# Patient Record
Sex: Male | Born: 1975 | Race: White | Hispanic: No | Marital: Married | Smoking: Never smoker
Health system: Southern US, Community
[De-identification: ages and names within clinical notes are randomized; demographics above are authoritative.]

## PROBLEM LIST (undated history)

## (undated) HISTORY — PX: EYE SURGERY: SHX253

## (undated) HISTORY — PX: KNEE ARTHROSCOPY W/ MENISCAL REPAIR: SHX1877

---

## 2006-09-08 ENCOUNTER — Emergency Department (HOSPITAL_COMMUNITY): Admission: EM | Admit: 2006-09-08 | Discharge: 2006-09-08 | Payer: Self-pay | Admitting: Emergency Medicine

## 2006-09-09 ENCOUNTER — Ambulatory Visit (HOSPITAL_COMMUNITY): Admission: RE | Admit: 2006-09-09 | Discharge: 2006-09-09 | Payer: Self-pay | Admitting: Orthopedic Surgery

## 2006-12-26 ENCOUNTER — Emergency Department (HOSPITAL_COMMUNITY): Admission: EM | Admit: 2006-12-26 | Discharge: 2006-12-26 | Payer: Self-pay | Admitting: Emergency Medicine

## 2007-03-19 ENCOUNTER — Encounter (INDEPENDENT_AMBULATORY_CARE_PROVIDER_SITE_OTHER): Payer: Self-pay | Admitting: Orthopedic Surgery

## 2007-03-19 ENCOUNTER — Ambulatory Visit (HOSPITAL_COMMUNITY): Admission: RE | Admit: 2007-03-19 | Discharge: 2007-03-19 | Payer: Self-pay | Admitting: Orthopedic Surgery

## 2007-03-19 ENCOUNTER — Ambulatory Visit: Payer: Self-pay | Admitting: Vascular Surgery

## 2008-03-30 ENCOUNTER — Encounter: Admission: RE | Admit: 2008-03-30 | Discharge: 2008-03-30 | Payer: Self-pay | Admitting: Internal Medicine

## 2009-09-06 ENCOUNTER — Emergency Department (HOSPITAL_BASED_OUTPATIENT_CLINIC_OR_DEPARTMENT_OTHER): Admission: EM | Admit: 2009-09-06 | Discharge: 2009-09-06 | Payer: Self-pay | Admitting: Emergency Medicine

## 2009-09-07 ENCOUNTER — Emergency Department (HOSPITAL_BASED_OUTPATIENT_CLINIC_OR_DEPARTMENT_OTHER): Admission: EM | Admit: 2009-09-07 | Discharge: 2009-09-07 | Payer: Self-pay | Admitting: Emergency Medicine

## 2010-09-25 LAB — URINALYSIS, ROUTINE W REFLEX MICROSCOPIC
Ketones, ur: NEGATIVE mg/dL
Nitrite: NEGATIVE
Protein, ur: NEGATIVE mg/dL

## 2010-12-19 ENCOUNTER — Emergency Department (HOSPITAL_BASED_OUTPATIENT_CLINIC_OR_DEPARTMENT_OTHER)
Admission: EM | Admit: 2010-12-19 | Discharge: 2010-12-19 | Disposition: A | Discharge status: Home / Self Care | Payer: Self-pay | Attending: Emergency Medicine | Admitting: Emergency Medicine

## 2010-12-19 DIAGNOSIS — K029 Dental caries, unspecified: Secondary | ICD-10-CM | POA: Insufficient documentation

## 2010-12-19 DIAGNOSIS — F17200 Nicotine dependence, unspecified, uncomplicated: Secondary | ICD-10-CM | POA: Insufficient documentation

## 2011-03-28 ENCOUNTER — Encounter: Payer: Self-pay | Admitting: *Deleted

## 2011-03-28 ENCOUNTER — Emergency Department (HOSPITAL_BASED_OUTPATIENT_CLINIC_OR_DEPARTMENT_OTHER)
Admission: EM | Admit: 2011-03-28 | Discharge: 2011-03-29 | Disposition: A | Discharge status: Home / Self Care | Payer: Self-pay | Attending: Emergency Medicine | Admitting: Emergency Medicine

## 2011-03-28 DIAGNOSIS — M5430 Sciatica, unspecified side: Secondary | ICD-10-CM | POA: Insufficient documentation

## 2011-03-28 DIAGNOSIS — M549 Dorsalgia, unspecified: Secondary | ICD-10-CM | POA: Insufficient documentation

## 2011-03-28 NOTE — ED Notes (Signed)
Walking and tripped this afternoon, twisted his back and pain has become increasingly worse

## 2011-03-29 MED ORDER — HYDROCODONE-ACETAMINOPHEN 5-325 MG PO TABS
ORAL_TABLET | ORAL | Status: AC
  Administered 2011-03-29: 2
  Filled 2011-03-29: qty 2

## 2011-03-29 MED ORDER — HYDROCODONE-ACETAMINOPHEN 10-325 MG PO TABS: 1.0000 | ORAL_TABLET | Freq: Four times a day (QID) | ORAL | Status: AC | PRN

## 2011-03-29 MED ORDER — NAPROXEN SODIUM 220 MG PO TABS: 440.0000 mg | ORAL_TABLET | Freq: Two times a day (BID) | ORAL | Status: AC

## 2011-03-29 NOTE — ED Notes (Signed)
Care plan and use of Meds reviewed follow up with Dr Jamey Ripa this week.

## 2011-03-29 NOTE — ED Notes (Signed)
Family at bedside.Care plan and safe use of meds reviewed

## 2011-03-29 NOTE — ED Provider Notes (Signed)
History     CSN: [id number] Arrival date & time: 03/28/2011 11:21 PM  Chief Complaint  Patient presents with  . Back Pain    (Consider location/radiation/quality/duration/timing/severity/associated sxs/prior treatment) HPI This is a 35 year old white male who was walking to the mailbox this afternoon and twisted wrong. He states it was sudden pain in the right lower back. This subsequently worsened and radiates down the back of his right leg. It is severe at its worst. It is worse with movement and improved when bearing weight on his left leg. There is also some improvement with certain positions. There is no associated numbness or weakness. There is no saddle anesthesia or bowel or bladder changes. He has had back pain in the past but never this severe.  History reviewed. No pertinent past medical history.  History reviewed. No pertinent past surgical history.  No family history on file.  History  Substance Use Topics  . Smoking status: Never Smoker   . Smokeless tobacco: Not on file  . Alcohol Use: No      Review of Systems  All other systems reviewed and are negative.    Allergies  Review of patient's allergies indicates no known allergies.  Home Medications   Current Outpatient Rx  Name Route Sig Dispense Refill  . IBUPROFEN 200 MG PO TABS Oral Take 400 mg by mouth every 6 (six) hours as needed. For pain       BP 140/87  Pulse 85  Temp(Src) 98 F (36.7 C) (Oral)  Resp 18  SpO2 100%  Physical Exam General: Well-developed, well-nourished male in no acute distress; appearance consistent with age of record HENT: normocephalic, atraumatic Eyes: pupils equal round and reactive to light; extraocular muscles intact Neck: supple Heart: regular rate and rhythm Lungs: clear to auscultation bilaterally Abdomen: soft; nontender; nondistended Extremities: No deformity; decreased range of motion range of motion right hip due to pain; pulses normal Back: Right lower  paralumbar tenderness Neurologic: Awake, alert and oriented;motor function intact in all extremities and symmetric;sensation grossly intact; no facial droop Skin: Warm and dry   ED Course  Procedures (including critical care time)  Labs Reviewed - No data to display No results found.   No diagnosis found.    MDM          Hanley Seamen, MD 03/29/11 [phone]

## 2011-03-29 NOTE — ED Notes (Signed)
Patient is resting comfortably.Ambulatory with pain controlled after receiving med

## 2011-09-25 ENCOUNTER — Emergency Department (HOSPITAL_BASED_OUTPATIENT_CLINIC_OR_DEPARTMENT_OTHER)
Admission: EM | Admit: 2011-09-25 | Discharge: 2011-09-25 | Disposition: A | Discharge status: Home / Self Care | Payer: Self-pay | Attending: Emergency Medicine | Admitting: Emergency Medicine

## 2011-09-25 ENCOUNTER — Emergency Department (INDEPENDENT_AMBULATORY_CARE_PROVIDER_SITE_OTHER): Payer: Self-pay

## 2011-09-25 ENCOUNTER — Encounter (HOSPITAL_BASED_OUTPATIENT_CLINIC_OR_DEPARTMENT_OTHER): Payer: Self-pay

## 2011-09-25 DIAGNOSIS — N1330 Unspecified hydronephrosis: Secondary | ICD-10-CM

## 2011-09-25 DIAGNOSIS — R109 Unspecified abdominal pain: Secondary | ICD-10-CM | POA: Insufficient documentation

## 2011-09-25 DIAGNOSIS — N201 Calculus of ureter: Secondary | ICD-10-CM | POA: Insufficient documentation

## 2011-09-25 DIAGNOSIS — R112 Nausea with vomiting, unspecified: Secondary | ICD-10-CM

## 2011-09-25 LAB — URINALYSIS, ROUTINE W REFLEX MICROSCOPIC
Bilirubin Urine: NEGATIVE
Nitrite: NEGATIVE
Protein, ur: NEGATIVE mg/dL
Specific Gravity, Urine: 1.024 (ref 1.005–1.030)
Urobilinogen, UA: 0.2 mg/dL (ref 0.0–1.0)

## 2011-09-25 LAB — BASIC METABOLIC PANEL
BUN: 15 mg/dL (ref 6–23)
Calcium: 8.9 mg/dL (ref 8.4–10.5)
GFR calc Af Amer: >90 mL/min (ref >90)
GFR calc non Af Amer: >90 mL/min (ref >90)
Potassium: 3.6 mEq/L (ref 3.5–5.1)
Sodium: 139 mEq/L (ref 135–145)

## 2011-09-25 LAB — URINE MICROSCOPIC-ADD ON

## 2011-09-25 LAB — CBC
MCHC: 35.1 g/dL (ref 30.0–36.0)
RDW: 12.8 % (ref 11.5–15.5)

## 2011-09-25 MED ORDER — PROMETHAZINE HCL 25 MG PO TABS: 25.0000 mg | ORAL_TABLET | Freq: Four times a day (QID) | ORAL | Status: AC | PRN

## 2011-09-25 MED ORDER — ONDANSETRON HCL 4 MG/2ML IJ SOLN
4.0000 mg | Freq: Once | INTRAMUSCULAR | Status: AC
  Administered 2011-09-25: 4 mg via INTRAVENOUS

## 2011-09-25 MED ORDER — TAMSULOSIN HCL 0.4 MG PO CAPS: 0.4000 mg | ORAL_CAPSULE | Freq: Two times a day (BID) | ORAL | Status: AC

## 2011-09-25 MED ORDER — HYDROMORPHONE HCL PF 1 MG/ML IJ SOLN
1.0000 mg | Freq: Once | INTRAMUSCULAR | Status: AC
  Administered 2011-09-25: 1 mg via INTRAVENOUS

## 2011-09-25 MED ORDER — HYDROMORPHONE HCL PF 1 MG/ML IJ SOLN
1.0000 mg | Freq: Once | INTRAMUSCULAR | Status: AC
  Administered 2011-09-25: 1 mg via INTRAVENOUS
  Filled 2011-09-25: qty 1

## 2011-09-25 MED ORDER — HYDROCODONE-ACETAMINOPHEN 5-500 MG PO TABS: 1.0000 | ORAL_TABLET | Freq: Four times a day (QID) | ORAL | Status: AC | PRN

## 2011-09-25 MED ORDER — OXYCODONE-ACETAMINOPHEN 5-325 MG PO TABS
2.0000 | ORAL_TABLET | Freq: Once | ORAL | Status: AC
  Administered 2011-09-25: 2 via ORAL
  Filled 2011-09-25: qty 2

## 2011-09-25 MED ORDER — IBUPROFEN 800 MG PO TABS: 800.0000 mg | ORAL_TABLET | Freq: Three times a day (TID) | ORAL | Status: AC

## 2011-09-25 MED ORDER — ONDANSETRON HCL 4 MG/2ML IJ SOLN
INTRAMUSCULAR | Status: AC
  Administered 2011-09-25: 4 mg via INTRAVENOUS
  Filled 2011-09-25: qty 2

## 2011-09-25 MED ORDER — HYDROMORPHONE HCL PF 1 MG/ML IJ SOLN
INTRAMUSCULAR | Status: AC
  Administered 2011-09-25: 1 mg via INTRAVENOUS
  Filled 2011-09-25: qty 1

## 2011-09-25 MED ORDER — KETOROLAC TROMETHAMINE 30 MG/ML IJ SOLN
30.0000 mg | Freq: Once | INTRAMUSCULAR | Status: AC
  Administered 2011-09-25: 30 mg via INTRAVENOUS
  Filled 2011-09-25: qty 1

## 2011-09-25 NOTE — ED Provider Notes (Signed)
History     CSN: [id number]  Arrival date & time 09/25/11  0346   First MD Initiated Contact with Patient 09/25/11 0359      Chief Complaint  Patient presents with  . Flank Pain    (Consider location/radiation/quality/duration/timing/severity/associated sxs/prior treatment) HPI Comments: 2 suture old male with no significant past medical history who presents with acute onset of left flank pain that awoke him from sleep in the last hour. Symptoms are persistent, severe, associated with nausea vomiting and diaphoresis.  Has no history of kidney stones or abdominal surgery. Nothing seems to make this better or worse, it is sharp and stabbing and pressure-like in the left flank with no radiation to the groin, testicles or abdomen  Patient is a 36 y.o. male presenting with flank pain. The history is provided by the patient and the spouse.  Flank Pain    History reviewed. No pertinent past medical history.  Past Surgical History  Procedure Date  . Eye surgery   . Knee arthroscopy w/ meniscal repair     History reviewed. No pertinent family history.  History  Substance Use Topics  . Smoking status: Never Smoker   . Smokeless tobacco: Not on file  . Alcohol Use: No      Review of Systems  Genitourinary: Positive for flank pain.  All other systems reviewed and are negative.    Allergies  Review of patient's allergies indicates no known allergies.  Home Medications   Current Outpatient Rx  Name Route Sig Dispense Refill  . HYDROCODONE-ACETAMINOPHEN 5-500 MG PO TABS Oral Take 1-2 tablets by mouth every 6 (six) hours as needed for pain. 15 tablet 0  . IBUPROFEN 200 MG PO TABS Oral Take 400 mg by mouth every 6 (six) hours as needed. For pain     . IBUPROFEN 800 MG PO TABS Oral Take 1 tablet (800 mg total) by mouth 3 (three) times daily. 21 tablet 0  . NAPROXEN SODIUM 220 MG PO TABS Oral Take 2 tablets (440 mg total) by mouth 2 (two) times daily with a meal.    .  PROMETHAZINE HCL 25 MG PO TABS Oral Take 1 tablet (25 mg total) by mouth every 6 (six) hours as needed for nausea. 12 tablet 0  . TAMSULOSIN HCL 0.4 MG PO CAPS Oral Take 1 capsule (0.4 mg total) by mouth 2 (two) times daily. 10 capsule 0    BP 154/99  Pulse 76  Resp 24  SpO2 98%  Physical Exam  Nursing note and vitals reviewed. Constitutional: He appears well-developed and well-nourished. He appears distressed.  HENT:  Head: Normocephalic and atraumatic.  Mouth/Throat: Oropharynx is clear and moist. No oropharyngeal exudate.  Eyes: Conjunctivae and EOM are normal. Pupils are equal, round, and reactive to light. Right eye exhibits no discharge. Left eye exhibits no discharge. No scleral icterus.  Neck: Normal range of motion. Neck supple. No JVD present. No thyromegaly present.  Cardiovascular: Normal rate, regular rhythm, normal heart sounds and intact distal pulses.  Exam reveals no gallop and no friction rub.   No murmur heard. Pulmonary/Chest: Effort normal and breath sounds normal. No respiratory distress. He has no wheezes. He has no rales.  Abdominal: Soft. Bowel sounds are normal. He exhibits no distension and no mass. There is no tenderness.       Left-sided CVA tenderness, though abdomen is nontender pain refers to the left flank when palpated in the abdomen. Non-peritoneal, no tenderness in left lower quadrant or right lower  quadrant or right upper 100  Musculoskeletal: Normal range of motion. He exhibits no edema and no tenderness.  Lymphadenopathy:    He has no cervical adenopathy.  Neurological: He is alert. Coordination normal.  Skin: Skin is warm. No rash noted. No erythema.       Diaphoretic  Psychiatric: He has a normal mood and affect. His behavior is normal.    ED Course  Procedures (including critical care time)  Labs Reviewed  CBC - Abnormal; Notable for the following:    WBC 15.3 (*)    Platelets 455 (*)    All other components within normal limits  BASIC  METABOLIC PANEL - Abnormal; Notable for the following:    Glucose, Bld 161 (*)    All other components within normal limits  URINALYSIS, ROUTINE W REFLEX MICROSCOPIC - Abnormal; Notable for the following:    Glucose, UA >1000 (*)    All other components within normal limits  URINE MICROSCOPIC-ADD ON   Ct Abdomen Pelvis Wo Contrast  09/25/2011  *RADIOLOGY REPORT*  Clinical Data: Sudden onset of left flank pain, nausea and vomiting.  CT ABDOMEN AND PELVIS WITHOUT CONTRAST  Technique:  Multidetector CT imaging of the abdomen and pelvis was performed following the standard protocol without intravenous contrast.  Comparison: None.  Findings: The visualized lung bases are clear.  The liver and spleen are unremarkable in appearance.  The gallbladder is within normal limits.  The pancreas and adrenal glands are unremarkable.  There is mild left-sided hydronephrosis, with prominence of the left ureter to the level of an obstructing 3 mm distal stone at the left vesicoureteral junction.  Mild associated soft tissue stranding is noted about the left renal pelvis.  The right kidney is grossly unremarkable in appearance.  No significant perinephric stranding is appreciated.  No free fluid is identified.  The small bowel is unremarkable in appearance.  The stomach is within normal limits.  No acute vascular abnormalities are seen.  The appendix is normal in caliber, without evidence for appendicitis.  The colon is grossly unremarkable in appearance.  The bladder is mildly distended and grossly unremarkable in appearance.  A small urachal remnant is incidentally noted.  The prostate remains normal in size.  No inguinal lymphadenopathy is seen.  No acute osseous abnormalities are identified.  IMPRESSION: Mild left-sided hydronephrosis, with an obstructing 3 mm distal stone at the left vesicoureteral junction.  Mild associated soft tissue stranding about the left renal pelvis.  Original Report Authenticated By: Tonia Ghent, M.D.     1. Ureterolithiasis       MDM  Evaluate for kidney stone, urinalysis, kidney function, CT scan, medications  ED Course:  Patient has had significant improvement with hydromorphone and intravascular Toradol, Zofran for nausea. Vital signs have remained stable, laboratory evaluation shows no signs of renal failure or urinary infection or hematuria. CT scan confirmed distal ureteral calculus.  Patient informed of these findings, strainer given prior to discharge   Labarotory results reviewed:  Normal kidney function, slightly elevated white blood cell count, normal hemoglobin, no hematuria or infection  Radiology imaging reviewed:  CT scan reveals distal ureteral calculus which is approximately 3 mm and is close to pass into the bladder.   Previous Records reviewed: No pertinent records   Medications given in ED: Hydromorphone, multiple doses, Toradol, Zofran  The pt and (or) family members were informed of results and need for close follow up  Consultation: Deferred to outpatient urology when necessary  Disposition:  Home     Discharge Prescriptions include:  Zofran Motrin Percocet Flomax      Vida Roller, MD 09/25/11 [phone]

## 2011-09-25 NOTE — Discharge Instructions (Signed)
See above instructions - Take Flomax, Percocet and Phenergan as indicated for pain / nausea - this kidney stone is 3mm big and is close to passing into your bladder - urinate into the strainer to catch the kidney stones - return hospital for severe or worsening pain / vomiting / fever.

## 2011-09-25 NOTE — ED Notes (Signed)
Pt woke up with Left side flank pain, moaning rate pain 10/10

## 2012-01-29 DIAGNOSIS — K029 Dental caries, unspecified: Secondary | ICD-10-CM | POA: Insufficient documentation

## 2012-01-30 ENCOUNTER — Encounter (HOSPITAL_BASED_OUTPATIENT_CLINIC_OR_DEPARTMENT_OTHER): Payer: Self-pay | Admitting: Emergency Medicine

## 2012-01-30 ENCOUNTER — Emergency Department (HOSPITAL_BASED_OUTPATIENT_CLINIC_OR_DEPARTMENT_OTHER)
Admission: EM | Admit: 2012-01-30 | Discharge: 2012-01-30 | Disposition: A | Discharge status: Home / Self Care | Payer: Self-pay | Attending: Emergency Medicine | Admitting: Emergency Medicine

## 2012-01-30 DIAGNOSIS — K029 Dental caries, unspecified: Secondary | ICD-10-CM

## 2012-01-30 MED ORDER — PENICILLIN V POTASSIUM 500 MG PO TABS: 500.0000 mg | ORAL_TABLET | Freq: Four times a day (QID) | ORAL | Status: AC

## 2012-01-30 MED ORDER — HYDROCODONE-ACETAMINOPHEN 5-500 MG PO TABS: 2.0000 | ORAL_TABLET | Freq: Four times a day (QID) | ORAL | Status: AC | PRN

## 2012-01-30 NOTE — ED Notes (Signed)
Pt c/o right sided facial pain x 2 days significantly worsening tonight.

## 2012-01-30 NOTE — ED Provider Notes (Signed)
History     CSN: [id number]  Arrival date & time 01/29/12  2356   First MD Initiated Contact with Patient 01/30/12 0008      Chief Complaint  Patient presents with  . Facial Pain    (Consider location/radiation/quality/duration/timing/severity/associated sxs/prior treatment) Patient is a 36 y.o. male presenting with tooth pain. The history is provided by the patient and the spouse.  Dental PainThe primary symptoms include mouth pain. Primary symptoms do not include dental injury, oral bleeding or fever. The symptoms began 2 days ago. The symptoms are worsening. The symptoms are new. The symptoms occur constantly.  Additional symptoms include: dental sensitivity to temperature. Additional symptoms do not include: gum swelling, gum tenderness, purulent gums, trismus, facial swelling, trouble swallowing, pain with swallowing, excessive salivation and taste disturbance. Medical issues do not include: alcohol problem.    History reviewed. No pertinent past medical history.  Past Surgical History  Procedure Date  . Eye surgery   . Knee arthroscopy w/ meniscal repair     No family history on file.  History  Substance Use Topics  . Smoking status: Never Smoker   . Smokeless tobacco: Not on file  . Alcohol Use: No      Review of Systems  Constitutional: Negative for fever.  HENT: Negative for facial swelling and trouble swallowing.   All other systems reviewed and are negative.    Allergies  Review of patient's allergies indicates no known allergies.  Home Medications   Current Outpatient Rx  Name Route Sig Dispense Refill  . IBUPROFEN 200 MG PO TABS Oral Take 400 mg by mouth every 6 (six) hours as needed. For pain     . HYDROCODONE-ACETAMINOPHEN 5-500 MG PO TABS Oral Take 2 tablets by mouth every 6 (six) hours as needed for pain. 10 tablet 0  . NAPROXEN SODIUM 220 MG PO TABS Oral Take 2 tablets (440 mg total) by mouth 2 (two) times daily with a meal.    . PENICILLIN V  POTASSIUM 500 MG PO TABS Oral Take 1 tablet (500 mg total) by mouth 4 (four) times daily. 40 tablet 0  . TAMSULOSIN HCL 0.4 MG PO CAPS Oral Take 1 capsule (0.4 mg total) by mouth 2 (two) times daily. 10 capsule 0    BP 158/87  Pulse 75  Temp 98.6 F (37 C) (Oral)  Resp 18  Ht 5\' 8"  (1.727 m)  Wt 230 lb (104.327 kg)  BMI 34.97 kg/m2  SpO2 100%  Physical Exam  Constitutional: He is oriented to person, place, and time. He appears well-developed and well-nourished. No distress.  HENT:  Head: Normocephalic and atraumatic. No trismus in the jaw.  Mouth/Throat: Oropharynx is clear and moist. No uvula swelling.    Eyes: Conjunctivae and EOM are normal.  Neck: Normal range of motion. Neck supple.  Cardiovascular: Normal rate and regular rhythm.   Pulmonary/Chest: Effort normal and breath sounds normal. He has no wheezes. He has no rales.  Abdominal: Soft. Bowel sounds are normal. There is no tenderness. There is no rebound and no guarding.  Musculoskeletal: Normal range of motion.  Lymphadenopathy:    He has no cervical adenopathy.  Neurological: He is alert and oriented to person, place, and time.  Skin: Skin is warm and dry.  Psychiatric: He has a normal mood and affect.    ED Course  Procedures (including critical care time)  Labs Reviewed - No data to display No results found.   1. Dental caries  MDM  Return for fevers swelling inability to open the mouth or swallow.  Follow up with dentistry        Loras Grieshop Smitty Cords, MD 01/30/12 [id number]

## 2012-02-06 ENCOUNTER — Emergency Department (HOSPITAL_BASED_OUTPATIENT_CLINIC_OR_DEPARTMENT_OTHER)
Admission: EM | Admit: 2012-02-06 | Discharge: 2012-02-06 | Disposition: A | Discharge status: Home / Self Care | Payer: Self-pay | Attending: Emergency Medicine | Admitting: Emergency Medicine

## 2012-02-06 ENCOUNTER — Encounter (HOSPITAL_BASED_OUTPATIENT_CLINIC_OR_DEPARTMENT_OTHER): Payer: Self-pay | Admitting: *Deleted

## 2012-02-06 DIAGNOSIS — K029 Dental caries, unspecified: Secondary | ICD-10-CM | POA: Insufficient documentation

## 2012-02-06 MED ORDER — CLINDAMYCIN HCL 300 MG PO CAPS: 300.0000 mg | ORAL_CAPSULE | Freq: Four times a day (QID) | ORAL | Status: AC

## 2012-02-06 MED ORDER — HYDROCODONE-ACETAMINOPHEN 5-500 MG PO TABS: 1.0000 | ORAL_TABLET | Freq: Four times a day (QID) | ORAL | Status: AC | PRN

## 2012-02-06 NOTE — ED Provider Notes (Signed)
History     CSN: [id number]  Arrival date & time 02/06/12  7829   First MD Initiated Contact with Patient 02/06/12 0530      Chief Complaint  Patient presents with  . Dental Pain    (Consider location/radiation/quality/duration/timing/severity/associated sxs/prior treatment) Patient is a 36 y.o. male presenting with tooth pain. The history is provided by the patient and the spouse.  Dental PainPrimary symptoms do not include oral bleeding, fever or shortness of breath. The symptoms began more than 1 week ago. The symptoms are worsening. The symptoms are chronic. The symptoms occur constantly.  Additional symptoms include: dental sensitivity to temperature and gum tenderness. Additional symptoms do not include: gum swelling, purulent gums, trismus, facial swelling, trouble swallowing, pain with swallowing, excessive salivation and drooling. Medical issues do not include: chewing tobacco.  Seen for same and pain initially got better then got worse again.  Has not been able to follow up  History reviewed. No pertinent past medical history.  Past Surgical History  Procedure Date  . Eye surgery   . Knee arthroscopy w/ meniscal repair     No family history on file.  History  Substance Use Topics  . Smoking status: Never Smoker   . Smokeless tobacco: Not on file  . Alcohol Use: No      Review of Systems  Constitutional: Negative for fever.  HENT: Negative for facial swelling, drooling, trouble swallowing and neck pain.   Respiratory: Negative for shortness of breath.   All other systems reviewed and are negative.    Allergies  Review of patient's allergies indicates no known allergies.  Home Medications   Current Outpatient Rx  Name Route Sig Dispense Refill  . CLINDAMYCIN HCL 300 MG PO CAPS Oral Take 1 capsule (300 mg total) by mouth 4 (four) times daily. X 7 days 28 capsule 0  . HYDROCODONE-ACETAMINOPHEN 5-500 MG PO TABS Oral Take 2 tablets by mouth every 6 (six)  hours as needed for pain. 10 tablet 0  . HYDROCODONE-ACETAMINOPHEN 5-500 MG PO TABS Oral Take 1 tablet by mouth every 6 (six) hours as needed for pain. 10 tablet 0  . IBUPROFEN 200 MG PO TABS Oral Take 400 mg by mouth every 6 (six) hours as needed. For pain     . NAPROXEN SODIUM 220 MG PO TABS Oral Take 2 tablets (440 mg total) by mouth 2 (two) times daily with a meal.    . PENICILLIN V POTASSIUM 500 MG PO TABS Oral Take 1 tablet (500 mg total) by mouth 4 (four) times daily. 40 tablet 0  . TAMSULOSIN HCL 0.4 MG PO CAPS Oral Take 1 capsule (0.4 mg total) by mouth 2 (two) times daily. 10 capsule 0    BP 155/102  Pulse 92  Temp 98.2 F (36.8 C) (Oral)  Resp 18  SpO2 100%  Physical Exam  Constitutional: He is oriented to person, place, and time. He appears well-developed and well-nourished.  HENT:  Head: Normocephalic and atraumatic.  Mouth/Throat: Oropharynx is clear and moist.    Eyes: Conjunctivae are normal. Pupils are equal, round, and reactive to light.  Neck: Normal range of motion. Neck supple. No tracheal deviation present.  Cardiovascular: Normal rate and regular rhythm.   Pulmonary/Chest: Effort normal and breath sounds normal.  Abdominal: Soft. Bowel sounds are normal. There is no tenderness.  Musculoskeletal: Normal range of motion.  Lymphadenopathy:    He has no cervical adenopathy.  Neurological: He is alert and oriented to person, place, and  time.  Skin: Skin is warm and dry. He is not diaphoretic.  Psychiatric: He has a normal mood and affect.    ED Course  Procedures (including critical care time)  Labs Reviewed - No data to display No results found.   1. Dental caries       MDM  Dental caries, multiple.  Will change abx to clindamycin.  Will need dental extraction, dentist referral provided.  Call [facility] dental clinic if unable to pay for private dentist as you have several teet needing extraction.  Patient and wife verbalize understanding and agree to  follow up       Maleni Seyer Smitty Cords, MD 02/06/12 [phone]

## 2012-02-06 NOTE — ED Notes (Signed)
Pt reports worsening right upper molar pain x1-2 weeks. Was seen here on 7/31 for same and prescribed Penicillin and vicodin. Reports worsening pain tonight. Unable to sleep.

## 2012-04-06 ENCOUNTER — Encounter (HOSPITAL_BASED_OUTPATIENT_CLINIC_OR_DEPARTMENT_OTHER): Payer: Self-pay | Admitting: *Deleted

## 2012-04-06 ENCOUNTER — Emergency Department (HOSPITAL_BASED_OUTPATIENT_CLINIC_OR_DEPARTMENT_OTHER)
Admission: EM | Admit: 2012-04-06 | Discharge: 2012-04-06 | Disposition: A | Discharge status: Home / Self Care | Payer: Self-pay | Attending: Emergency Medicine | Admitting: Emergency Medicine

## 2012-04-06 DIAGNOSIS — S025XXA Fracture of tooth (traumatic), initial encounter for closed fracture: Secondary | ICD-10-CM | POA: Insufficient documentation

## 2012-04-06 DIAGNOSIS — X58XXXA Exposure to other specified factors, initial encounter: Secondary | ICD-10-CM | POA: Insufficient documentation

## 2012-04-06 MED ORDER — HYDROCODONE-ACETAMINOPHEN 5-325 MG PO TABS
2.0000 | ORAL_TABLET | Freq: Once | ORAL | Status: AC
  Administered 2012-04-06: 2 via ORAL
  Filled 2012-04-06: qty 2

## 2012-04-06 MED ORDER — CLINDAMYCIN HCL 150 MG PO CAPS: 150.0000 mg | ORAL_CAPSULE | Freq: Four times a day (QID) | ORAL | Status: AC

## 2012-04-06 MED ORDER — HYDROCODONE-ACETAMINOPHEN 5-325 MG PO TABS
2.0000 | ORAL_TABLET | ORAL | Status: AC | PRN
Start: 2012-04-06 — End: 2012-04-16

## 2012-04-06 NOTE — ED Provider Notes (Signed)
Medical screening examination/treatment/procedure(s) were performed by non-physician practitioner and as supervising physician I was immediately available for consultation/collaboration.   Kerston Landeck, MD 04/06/12 2330 

## 2012-04-06 NOTE — ED Provider Notes (Signed)
History     CSN: [id number]  Arrival date & time 04/06/12  1652   First MD Initiated Contact with Patient 04/06/12 1826      Chief Complaint  Patient presents with  . Dental Pain    (Consider location/radiation/quality/duration/timing/severity/associated sxs/prior treatment) Patient is a 36 y.o. male presenting with tooth pain. The history is provided by the patient. No language interpreter was used.  Dental PainThe symptoms began 3 to 5 days ago. The symptoms are worsening. The symptoms are new. The symptoms occur constantly.  Medical issues do not include: alcohol problem.  Pt complains of pain from a broken tooth lower gum.    History reviewed. No pertinent past medical history.  Past Surgical History  Procedure Date  . Eye surgery   . Knee arthroscopy w/ meniscal repair     History reviewed. No pertinent family history.  History  Substance Use Topics  . Smoking status: Never Smoker   . Smokeless tobacco: Not on file  . Alcohol Use: No      Review of Systems  HENT: Positive for dental problem.   All other systems reviewed and are negative.    Allergies  Review of patient's allergies indicates no known allergies.  Home Medications   Current Outpatient Rx  Name Route Sig Dispense Refill  . IBUPROFEN 200 MG PO TABS Oral Take 400 mg by mouth every 6 (six) hours as needed. For pain     . TAMSULOSIN HCL 0.4 MG PO CAPS Oral Take 1 capsule (0.4 mg total) by mouth 2 (two) times daily. 10 capsule 0    BP 129/83  Pulse 88  Temp 98.4 F (36.9 C) (Oral)  Resp 20  Ht 5\' 9"  (1.753 m)  Wt 220 lb (99.791 kg)  BMI 32.49 kg/m2  SpO2 100%  Physical Exam  Vitals reviewed. Constitutional: He appears well-developed and well-nourished.  HENT:  Head: Normocephalic and atraumatic.  Right Ear: External ear normal.  Left Ear: External ear normal.       Broken tooth left lower gum line  Eyes: Pupils are equal, round, and reactive to light.  Neck: Normal range of  motion.  Pulmonary/Chest: Effort normal.  Abdominal: Soft.  Neurological: He is alert.  Skin: Skin is warm.    ED Course  Procedures (including critical care time)  Labs Reviewed - No data to display No results found.   No diagnosis found.    MDM  pcn vk and hydrocodone.   Pt ferred to Dr. Leanord Asal for recheck        Elson Areas, [place] 04/06/12 [phone]

## 2012-04-06 NOTE — ED Notes (Signed)
"  Bad tooth" Pain since Friday

## 2012-08-07 IMAGING — CT CT ABD-PELV W/O CM
2 of 4 series · 16 of 46 positions shown, 18 images · non-contrast
Comparison: None.

CLINICAL DATA: Sudden onset of left flank pain, nausea and
vomiting.

CT ABDOMEN AND PELVIS WITHOUT CONTRAST
TECHNIQUE: Multidetector CT imaging of the abdomen and pelvis was
performed following the standard protocol without intravenous
contrast.

[Series 2: renal stone < 200 lbs 5.0 b31f · axial · 0.79mm/px · z∈[+668,+1123]mm · 13 of 101 slices shown, 15 images]
[im 5/101  soft-tissue]
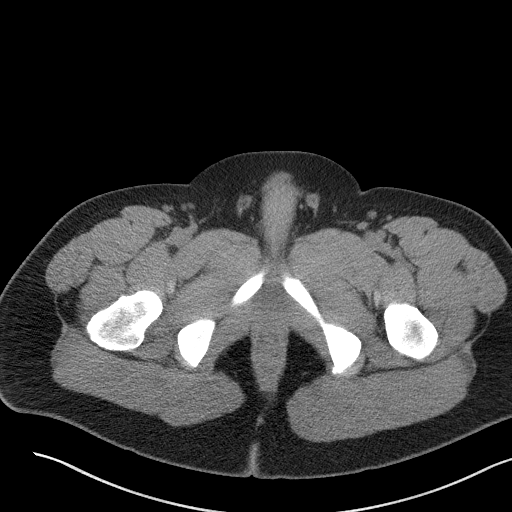
[im 5/101  bone]
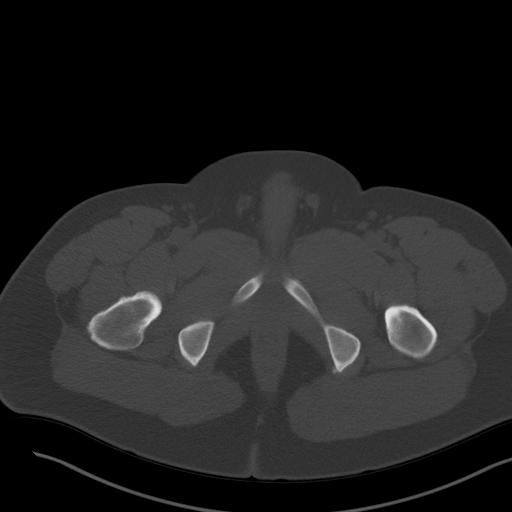
[im 14/101  soft-tissue]
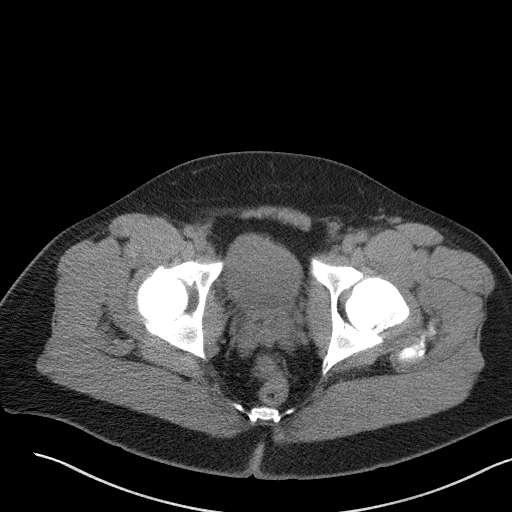
[im 22/101  soft-tissue]
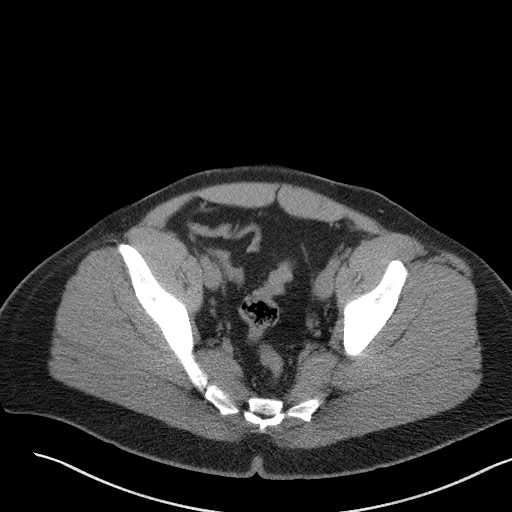
[im 27/101  soft-tissue]
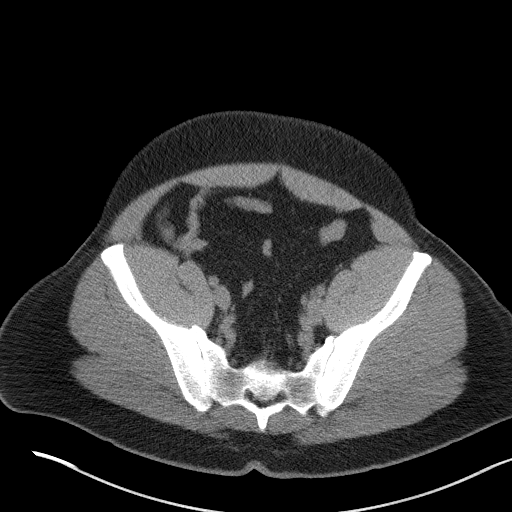
[im 35/101  soft-tissue]
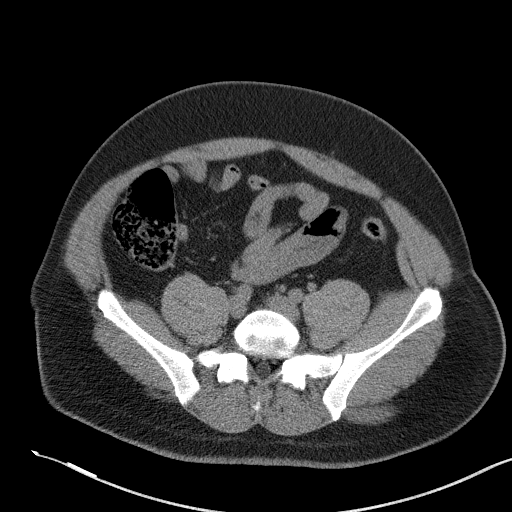
[im 44/101  soft-tissue]
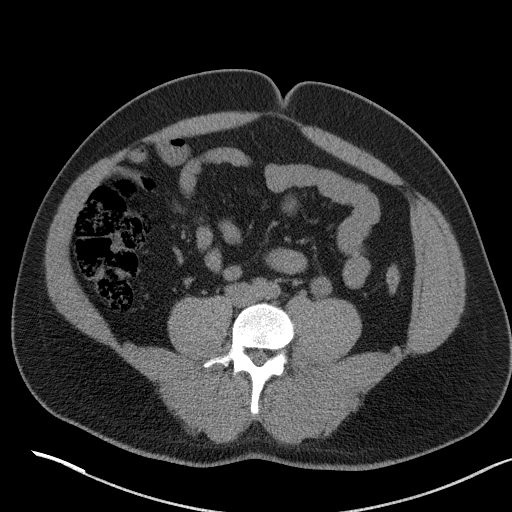
[im 53/101  soft-tissue]
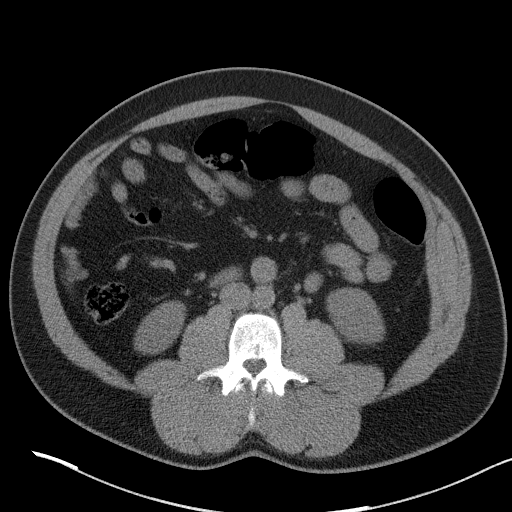
[im 57/101  soft-tissue]
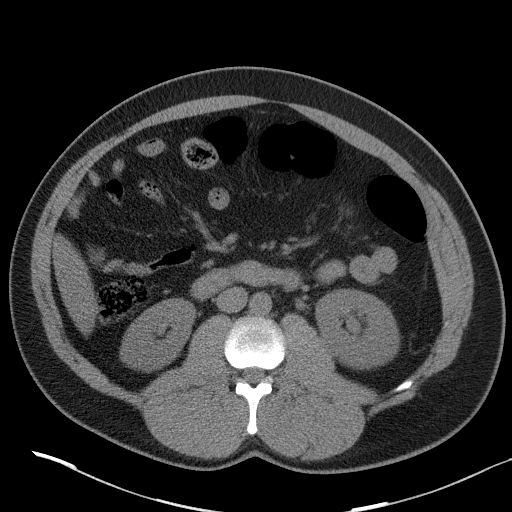
[im 66/101  soft-tissue]
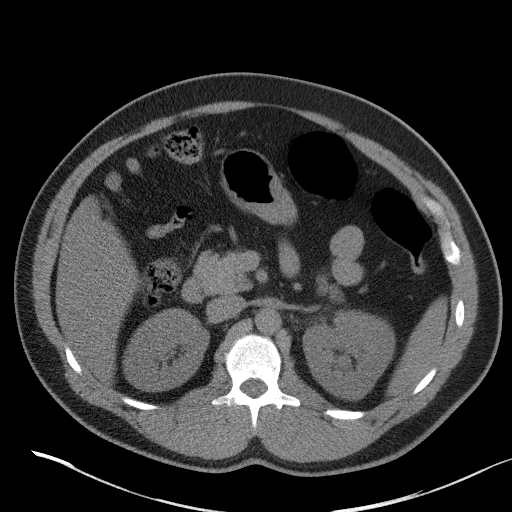
[im 66/101  bone]
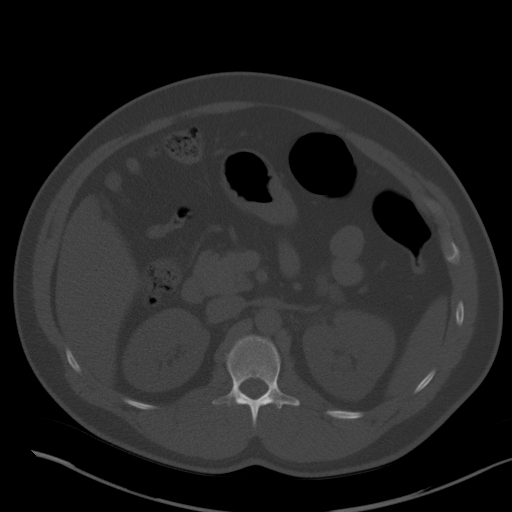
[im 74/101  soft-tissue]
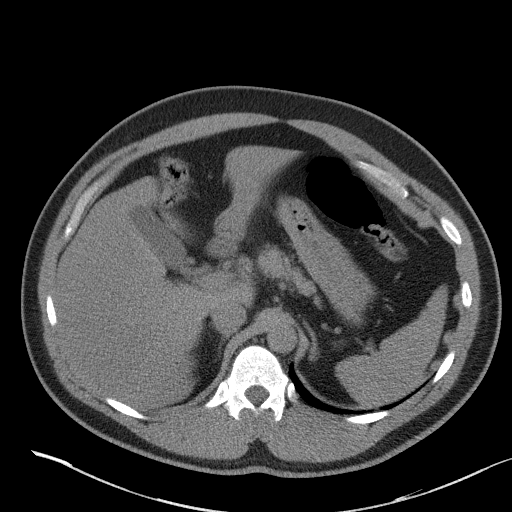
[im 79/101  soft-tissue]
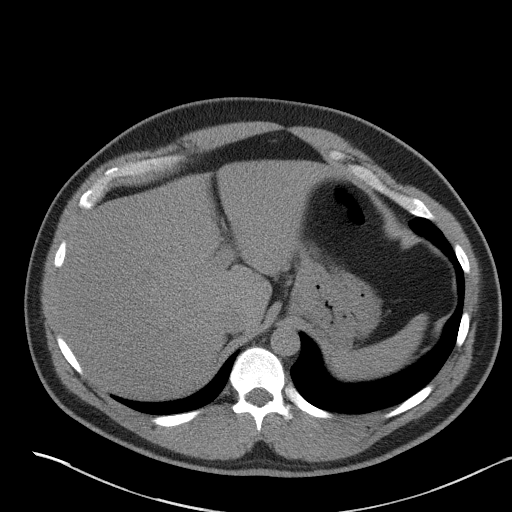
[im 87/101  soft-tissue]
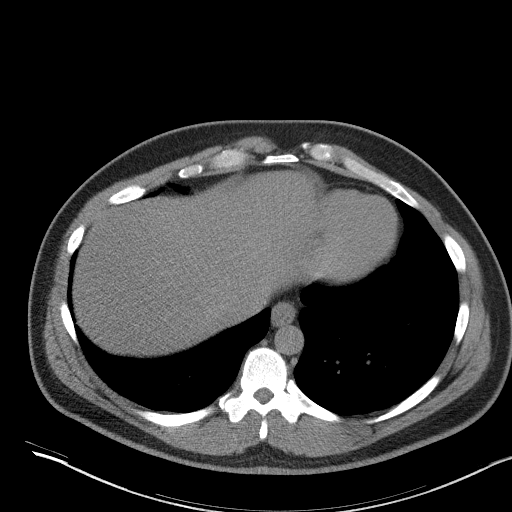
[im 96/101  soft-tissue]
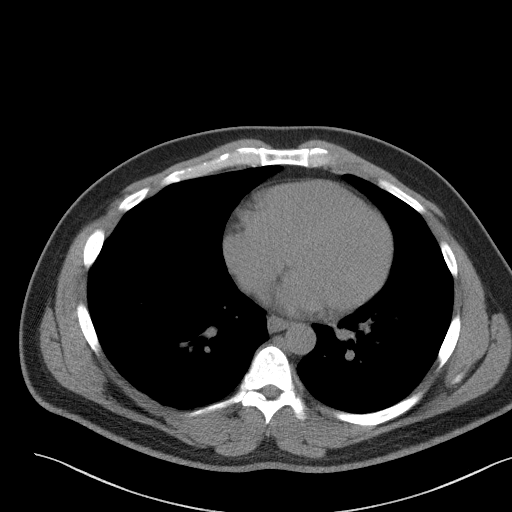

[Series 5: renal stone 3.0 coronal · coronal · 0.78mm/px · 3 of 117 slices shown]
[im 39/117  soft-tissue]
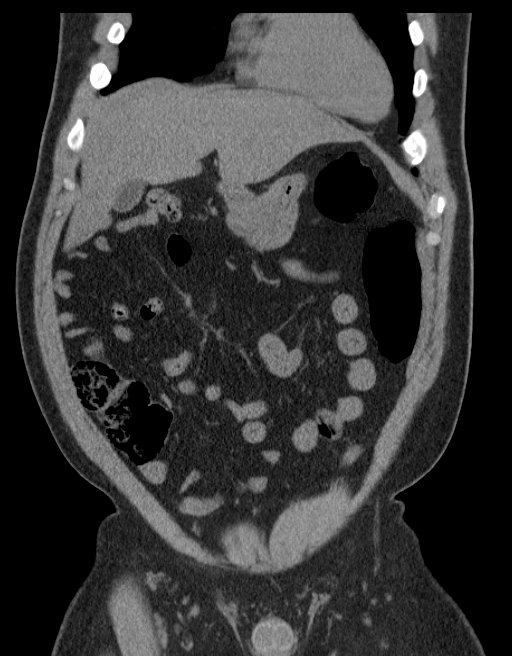
[im 52/117  soft-tissue]
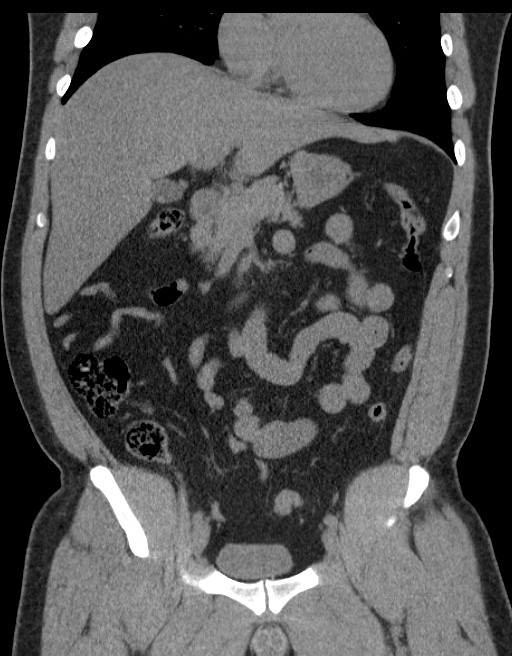
[im 65/117  soft-tissue]
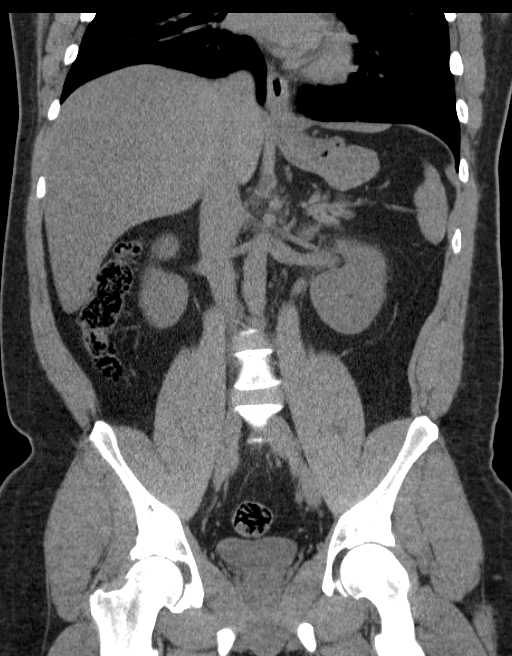

[16 of 46 positions shown; findings below may reference images not displayed]

FINDINGS: The visualized lung bases are clear.

The liver and spleen are unremarkable in appearance.  The
gallbladder is within normal limits.  The pancreas and adrenal
glands are unremarkable.

There is mild left-sided hydronephrosis, with prominence of the
left ureter to the level of an obstructing 3 mm distal stone at the
left vesicoureteral junction.  Mild associated soft tissue
stranding is noted about the left renal pelvis.

The right kidney is grossly unremarkable in appearance.  No
significant perinephric stranding is appreciated.

No free fluid is identified.  The small bowel is unremarkable in
appearance.  The stomach is within normal limits.  No acute
vascular abnormalities are seen.

The appendix is normal in caliber, without evidence for
appendicitis.  The colon is grossly unremarkable in appearance.

The bladder is mildly distended and grossly unremarkable in
appearance.  A small urachal remnant is incidentally noted.  The
prostate remains normal in size.  No inguinal lymphadenopathy is
seen.

No acute osseous abnormalities are identified.
IMPRESSION: Mild left-sided hydronephrosis, with an obstructing 3 mm distal
stone at the left vesicoureteral junction.  Mild associated soft
tissue stranding about the left renal pelvis.

## 2021-09-26 ENCOUNTER — Telehealth: Payer: Self-pay | Admitting: Physician Assistant

## 2021-09-26 NOTE — Telephone Encounter (Signed)
Referral attached to appointment

## 2021-09-26 NOTE — Telephone Encounter (Signed)
Patient is calling for a referral appointment from Ledora Bottcher, M.D.  Patient is scheduled for 05/10/2022 at 8:30 with [facility], PA-C. ?

## 2022-05-10 ENCOUNTER — Ambulatory Visit: Payer: Self-pay | Admitting: Physician Assistant
